# Patient Record
Sex: Male | Born: 1955 | Race: Black or African American | Hispanic: No | Marital: Married | Smoking: Current every day smoker
Health system: Southern US, Community
[De-identification: ages and names within clinical notes are randomized; demographics above are authoritative.]

## PROBLEM LIST (undated history)

## (undated) DIAGNOSIS — J309 Allergic rhinitis, unspecified: Secondary | ICD-10-CM

## (undated) DIAGNOSIS — I509 Heart failure, unspecified: Secondary | ICD-10-CM

## (undated) DIAGNOSIS — F17210 Nicotine dependence, cigarettes, uncomplicated: Secondary | ICD-10-CM

## (undated) DIAGNOSIS — J439 Emphysema, unspecified: Secondary | ICD-10-CM

## (undated) DIAGNOSIS — R809 Proteinuria, unspecified: Secondary | ICD-10-CM

## (undated) DIAGNOSIS — K7689 Other specified diseases of liver: Secondary | ICD-10-CM

## (undated) DIAGNOSIS — F1290 Cannabis use, unspecified, uncomplicated: Secondary | ICD-10-CM

## (undated) DIAGNOSIS — E46 Unspecified protein-calorie malnutrition: Secondary | ICD-10-CM

## (undated) DIAGNOSIS — F1090 Alcohol use, unspecified, uncomplicated: Secondary | ICD-10-CM

## (undated) DIAGNOSIS — F3340 Major depressive disorder, recurrent, in remission, unspecified: Secondary | ICD-10-CM

## (undated) DIAGNOSIS — R161 Splenomegaly, not elsewhere classified: Secondary | ICD-10-CM

## (undated) DIAGNOSIS — R911 Solitary pulmonary nodule: Secondary | ICD-10-CM

## (undated) DIAGNOSIS — E785 Hyperlipidemia, unspecified: Secondary | ICD-10-CM

## (undated) DIAGNOSIS — R7989 Other specified abnormal findings of blood chemistry: Secondary | ICD-10-CM

## (undated) DIAGNOSIS — D649 Anemia, unspecified: Secondary | ICD-10-CM

## (undated) DIAGNOSIS — K5900 Constipation, unspecified: Secondary | ICD-10-CM

## (undated) DIAGNOSIS — F32A Depression, unspecified: Secondary | ICD-10-CM

## (undated) DIAGNOSIS — J449 Chronic obstructive pulmonary disease, unspecified: Secondary | ICD-10-CM

## (undated) DIAGNOSIS — K769 Liver disease, unspecified: Secondary | ICD-10-CM

## (undated) DIAGNOSIS — E209 Hypoparathyroidism, unspecified: Secondary | ICD-10-CM

## (undated) DIAGNOSIS — I10 Essential (primary) hypertension: Secondary | ICD-10-CM

## (undated) DIAGNOSIS — D509 Iron deficiency anemia, unspecified: Secondary | ICD-10-CM

## (undated) DIAGNOSIS — I7121 Aneurysm of the ascending aorta, without rupture: Secondary | ICD-10-CM

## (undated) DIAGNOSIS — F4321 Adjustment disorder with depressed mood: Secondary | ICD-10-CM

## (undated) HISTORY — DX: Alcohol use, unspecified, uncomplicated: F10.90

## (undated) HISTORY — DX: Solitary pulmonary nodule: R91.1

## (undated) HISTORY — DX: Iron deficiency anemia, unspecified: D50.9

## (undated) HISTORY — DX: Anemia, unspecified: D64.9

## (undated) HISTORY — DX: Heart failure, unspecified: I50.9

## (undated) HISTORY — DX: Constipation, unspecified: K59.00

## (undated) HISTORY — DX: Liver disease, unspecified: K76.9

## (undated) HISTORY — DX: Emphysema, unspecified: J43.9

## (undated) HISTORY — DX: Hypoparathyroidism, unspecified: E20.9

## (undated) HISTORY — DX: Chronic obstructive pulmonary disease, unspecified: J44.9

## (undated) HISTORY — DX: Hyperlipidemia, unspecified: E78.5

## (undated) HISTORY — DX: Aneurysm of the ascending aorta, without rupture: I71.21

## (undated) HISTORY — DX: Cannabis use, unspecified, uncomplicated: F12.90

## (undated) HISTORY — DX: Splenomegaly, not elsewhere classified: R16.1

## (undated) HISTORY — DX: Depression, unspecified: F32.A

## (undated) HISTORY — DX: Proteinuria, unspecified: R80.9

## (undated) HISTORY — DX: Adjustment disorder with depressed mood: F43.21

## (undated) HISTORY — DX: Essential (primary) hypertension: I10

## (undated) HISTORY — DX: Unspecified protein-calorie malnutrition: E46

## (undated) HISTORY — DX: Other specified diseases of liver: K76.89

## (undated) HISTORY — DX: Other specified abnormal findings of blood chemistry: R79.89

## (undated) HISTORY — DX: Nicotine dependence, cigarettes, uncomplicated: F17.210

## (undated) HISTORY — DX: Major depressive disorder, recurrent, in remission, unspecified: F33.40

## (undated) HISTORY — DX: Allergic rhinitis, unspecified: J30.9

---

## 2008-10-11 ENCOUNTER — Emergency Department (HOSPITAL_COMMUNITY): Admission: EM | Admit: 2008-10-11 | Discharge: 2008-10-11 | Payer: Self-pay | Admitting: Emergency Medicine

## 2009-12-27 ENCOUNTER — Emergency Department (HOSPITAL_COMMUNITY): Admission: EM | Admit: 2009-12-27 | Discharge: 2009-12-27 | Payer: Self-pay | Admitting: Emergency Medicine

## 2014-10-04 ENCOUNTER — Encounter (HOSPITAL_COMMUNITY): Payer: Self-pay | Admitting: *Deleted

## 2014-10-04 ENCOUNTER — Emergency Department (HOSPITAL_COMMUNITY)
Admission: EM | Admit: 2014-10-04 | Discharge: 2014-10-04 | Disposition: A | Discharge status: Home / Self Care | Payer: Self-pay | Attending: Emergency Medicine | Admitting: Emergency Medicine

## 2014-10-04 ENCOUNTER — Emergency Department (HOSPITAL_COMMUNITY): Payer: Self-pay

## 2014-10-04 DIAGNOSIS — H938X1 Other specified disorders of right ear: Secondary | ICD-10-CM | POA: Insufficient documentation

## 2014-10-04 DIAGNOSIS — Z720 Tobacco use: Secondary | ICD-10-CM | POA: Insufficient documentation

## 2014-10-04 DIAGNOSIS — M25561 Pain in right knee: Secondary | ICD-10-CM | POA: Insufficient documentation

## 2014-10-04 DIAGNOSIS — G8929 Other chronic pain: Secondary | ICD-10-CM | POA: Insufficient documentation

## 2014-10-04 MED ORDER — IBUPROFEN 800 MG PO TABS: 800.0000 mg | ORAL_TABLET | Freq: Three times a day (TID) | ORAL | Status: AC | PRN

## 2014-10-04 NOTE — ED Notes (Signed)
Declined W/C at D/C and was escorted to lobby by RN. 

## 2014-10-04 NOTE — Discharge Instructions (Signed)
Return here as needed.  Follow-up with the clinic provided.  °

## 2014-10-04 NOTE — Discharge Planning (Signed)
Theodore Jacobs J. Theodore Laming, RN, [place], [place] [phone] ED CM consulted to meet with patient concerning f/u care with PCP and patient does not have insurance. Pt presented to [facility] ED today with skin growth at right ear.  Met with patient at bedside, confirmed informaton. Pt  reports not having access to f/u care with PCP, or insurance coverage. Discussed with patient importance and benefits of  establishing PCP, and not utilizing the ED for primary care needs. Pt verbalized understanding and is in agreement.   Pt voiced interest in the [facility] and [place].  [facility] Brochure given with address, phone number, and the services highlighted. Explained that there is a Customer service manager on site who will assist with [employer] and process. Instructed to call or walk in Monday - Friday between the hours of 9- 5:30p to schedule appt for establishing care for PCP. Pt verbalized understanding.

## 2014-10-04 NOTE — ED Notes (Signed)
Pt reports leg pain for a year and a skin growth at ear on Rt  for 2-3 months.

## 2014-10-04 NOTE — ED Provider Notes (Signed)
CSN: [id number]     Arrival date & time 10/04/14  0805 History   First MD Initiated Contact with Patient 10/04/14 [phone]     Chief Complaint  Patient presents with  . Leg Pain     (Consider location/radiation/quality/duration/timing/severity/associated sxs/prior Treatment) HPI Patient presents to the emergency department with several months worth of right knee pain.  The patient is also worried about a skin swelling of by his right ear.  The patient states that this is been present for quite a while.  Patient denies chest pain, shortness breath, weakness, dizziness, headache, blurred vision, back pain, neck pain, cough, fever, or syncope.  Patient states that his knee pain is worse first thing in the morning, gets better as the day goes on History reviewed. No pertinent past medical history. History reviewed. No pertinent past surgical history. History reviewed. No pertinent family history. History  Substance Use Topics  . Smoking status: Current Some Day Smoker    Types: Cigarettes  . Smokeless tobacco: Never Used  . Alcohol Use: Yes     Comment: social    Review of Systems All other systems negative except as documented in the HPI. All pertinent positives and negatives as reviewed in the HPI.    Allergies  Review of patient's allergies indicates not on file.  Home Medications   Prior to Admission medications   Not on File   BP 154/91 mmHg  Pulse 99  Temp(Src) 98.6 F (37 C) (Oral)  Resp 16  Ht 5\' 11"  (1.803 m)  Wt 150 lb (68.04 kg)  BMI 20.93 kg/m2  SpO2 100% Physical Exam  Constitutional: He is oriented to person, place, and time. He appears well-developed and well-nourished.  HENT:  Head: Normocephalic and atraumatic.    Eyes: Pupils are equal, round, and reactive to light.  Cardiovascular: Normal rate, regular rhythm and normal heart sounds.   Pulmonary/Chest: Effort normal and breath sounds normal.  Musculoskeletal:       Right knee: He exhibits normal range  of motion, no swelling, no effusion, no deformity, normal alignment, no LCL laxity, no bony tenderness and normal meniscus. No tenderness found.  Neurological: He is alert and oriented to person, place, and time.  Skin: Skin is warm and dry. No rash noted. No erythema.  Nursing note and vitals reviewed.   ED Course  Procedures (including critical care time) The patient will be treated for this knee pain that is chronic and referred for the area in front of his right ear   MDM   Final diagnoses:  None       Charlestine NightChristopher Jkwon Treptow, PA-C 10/08/14 [id number]  Doug SouSam Jacubowitz, MD 10/08/14 [phone]

## 2014-11-04 ENCOUNTER — Ambulatory Visit: Payer: Self-pay

## 2016-06-24 IMAGING — CR DG KNEE COMPLETE 4+V*R*
4 series · 4 of 4 positions shown · non-contrast
Comparison: None.

CLINICAL DATA: 50-year-old male with chronic lateral right knee
pain for the past several weeks. No known injury.

EXAM:
RIGHT KNEE - COMPLETE 4+ VIEW

[knee ap]
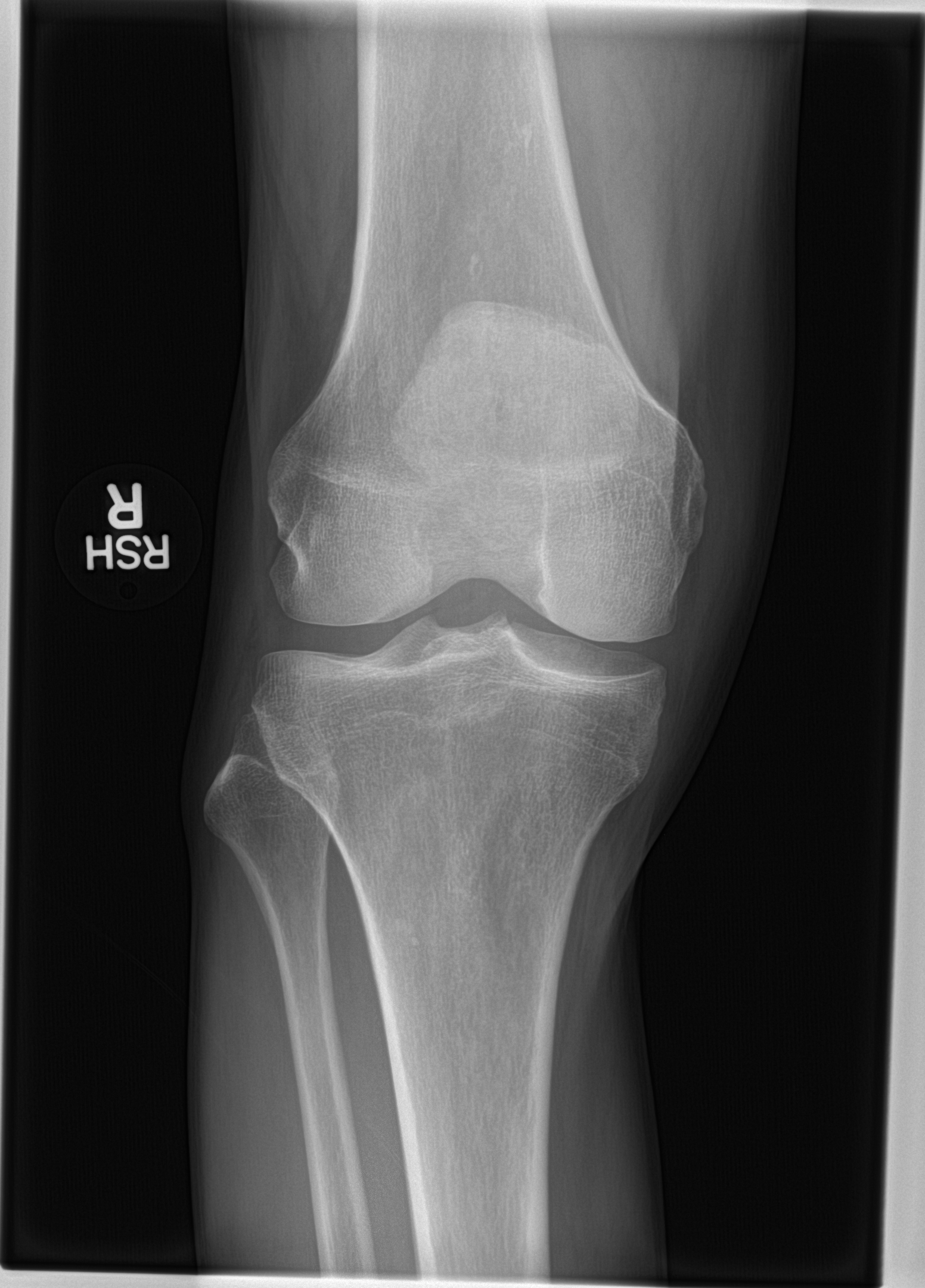

[knee obl (1 of 2)]
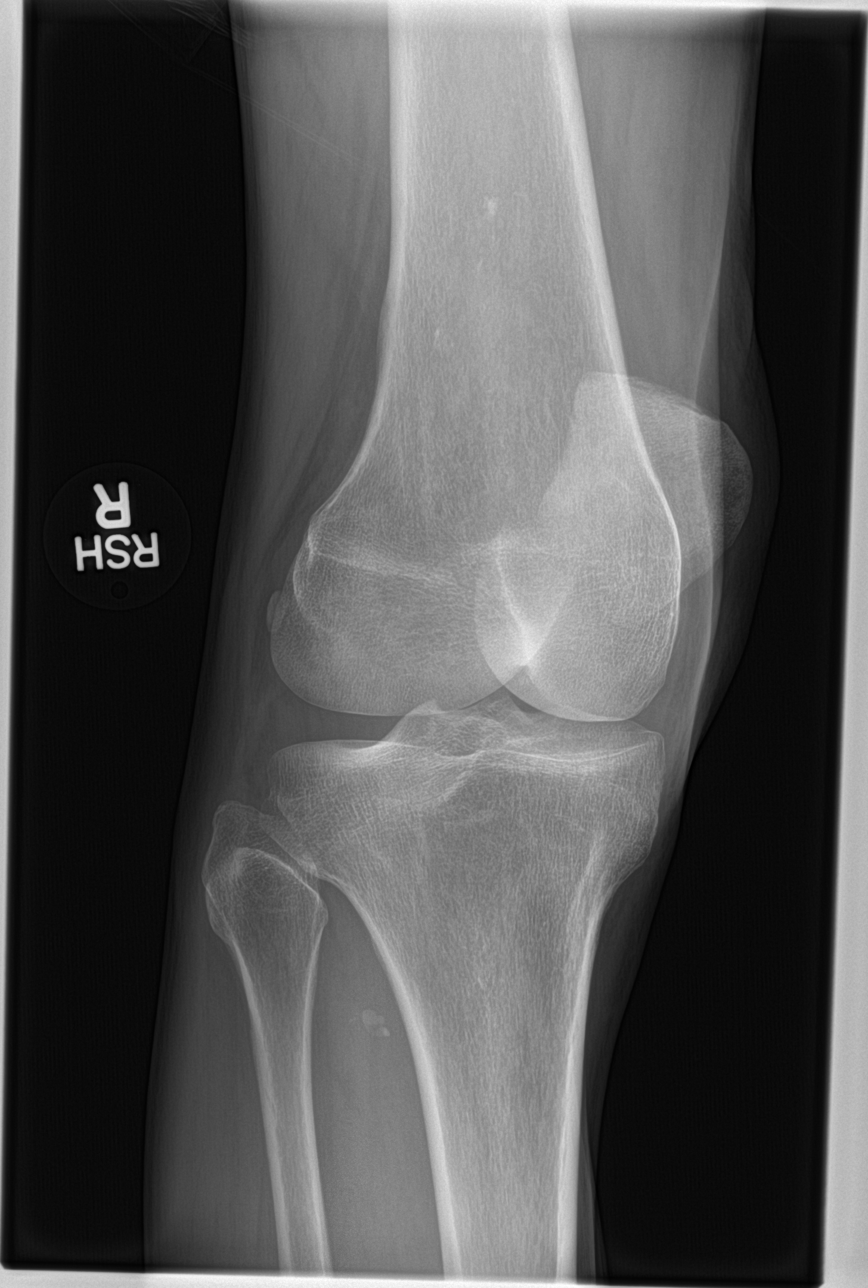

[knee obl (2 of 2)]
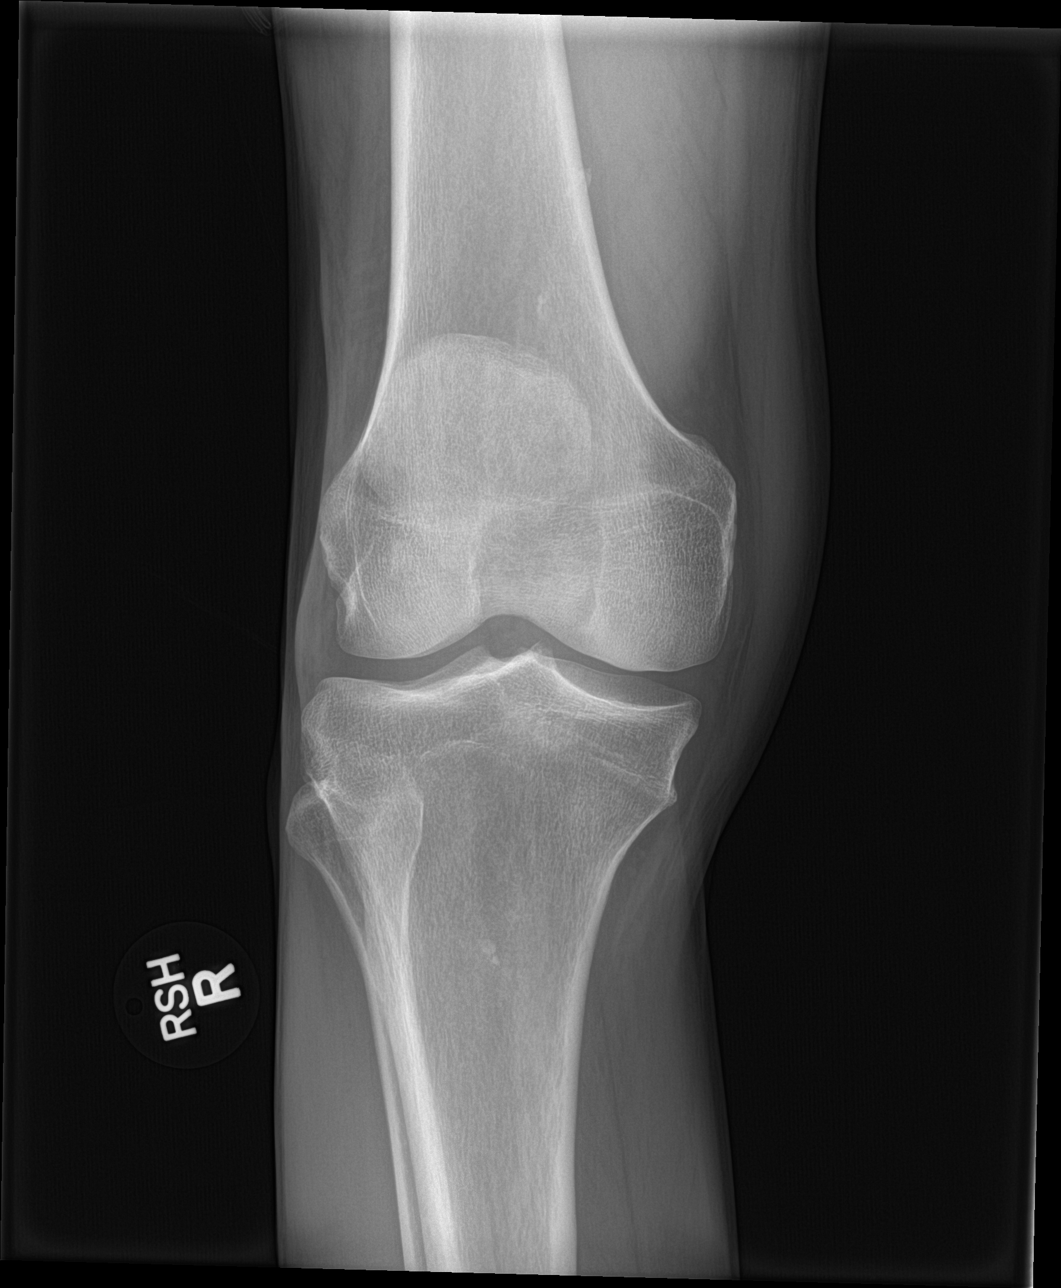

[knee lat]
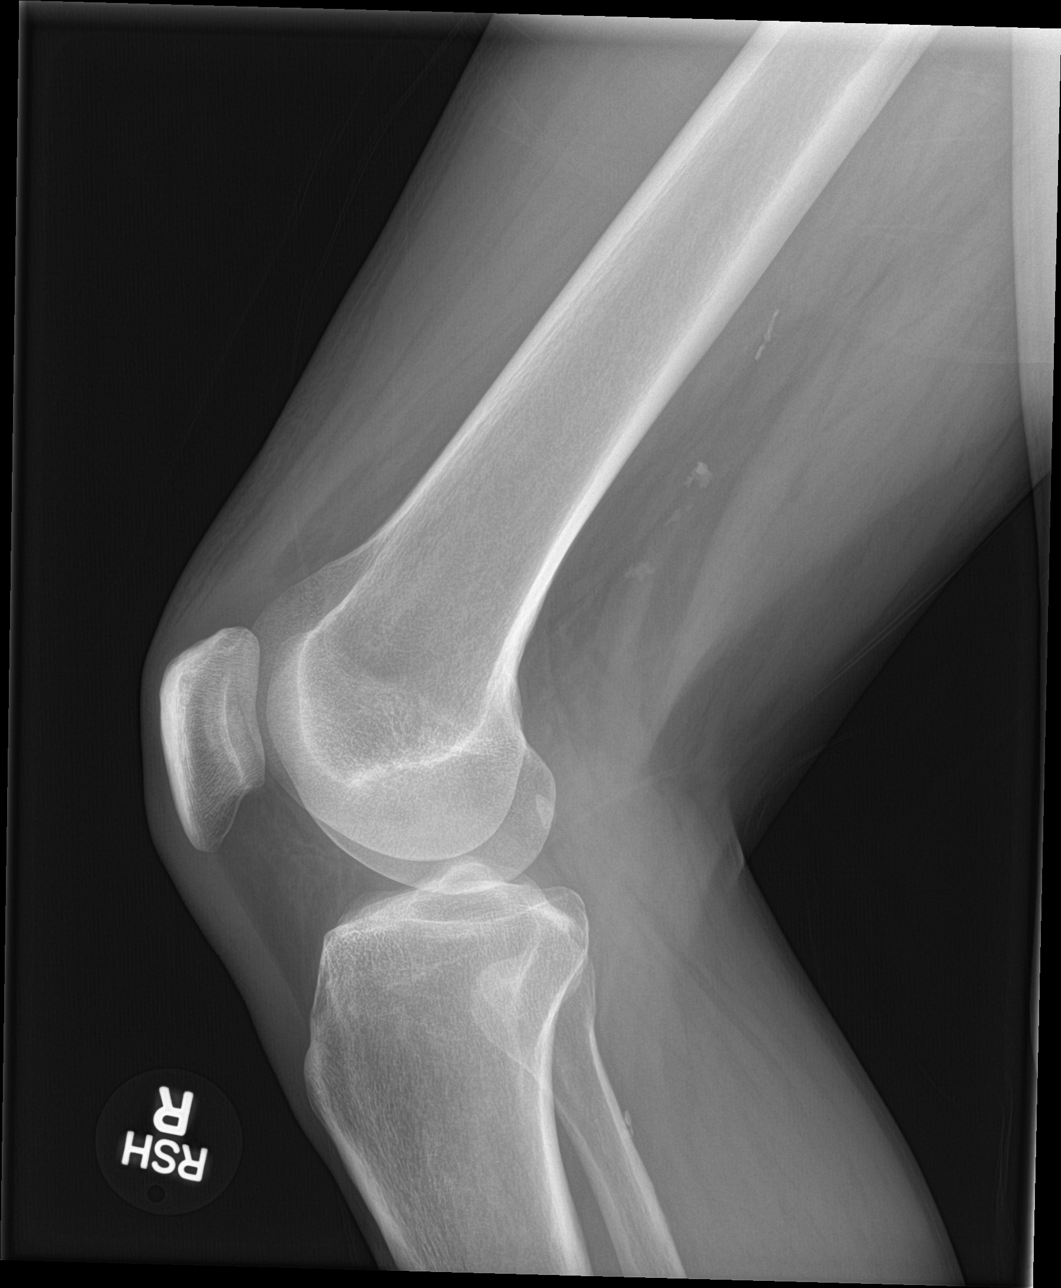

[4 of 4 positions shown; findings below may reference images not displayed]

FINDINGS: There is no evidence of fracture, dislocation, or joint effusion.
There is no evidence of arthropathy or other focal bone abnormality.
Soft tissues are unremarkable. Atherosclerotic plaque identified
within the distal superficial femoral, popliteal and runoff
arteries.
IMPRESSION: No acute osseous abnormality, malalignment or significant
degenerative change.

Mild atherosclerotic vascular calcifications.

## 2022-03-22 ENCOUNTER — Other Ambulatory Visit: Payer: Self-pay | Admitting: Student

## 2022-03-22 DIAGNOSIS — F17210 Nicotine dependence, cigarettes, uncomplicated: Secondary | ICD-10-CM

## 2022-03-28 ENCOUNTER — Other Ambulatory Visit: Payer: Self-pay | Admitting: Student

## 2022-03-28 ENCOUNTER — Ambulatory Visit
Admission: RE | Admit: 2022-03-28 | Discharge: 2022-03-28 | Disposition: A | Discharge status: Home / Self Care | Payer: Medicare HMO | Source: Ambulatory Visit | Attending: Student | Admitting: Student

## 2022-03-28 DIAGNOSIS — F17210 Nicotine dependence, cigarettes, uncomplicated: Secondary | ICD-10-CM

## 2022-07-29 DIAGNOSIS — I77810 Thoracic aortic ectasia: Secondary | ICD-10-CM

## 2022-07-29 HISTORY — DX: Thoracic aortic ectasia: I77.810

## 2022-08-08 ENCOUNTER — Other Ambulatory Visit: Payer: Self-pay | Admitting: Student

## 2022-08-08 DIAGNOSIS — F17210 Nicotine dependence, cigarettes, uncomplicated: Secondary | ICD-10-CM

## 2022-09-04 ENCOUNTER — Inpatient Hospital Stay: Admission: RE | Admit: 2022-09-04 | Payer: Medicare HMO | Source: Ambulatory Visit

## 2022-09-06 ENCOUNTER — Other Ambulatory Visit: Payer: Self-pay | Admitting: Student

## 2022-09-06 ENCOUNTER — Ambulatory Visit
Admission: RE | Admit: 2022-09-06 | Discharge: 2022-09-06 | Disposition: A | Discharge status: Home / Self Care | Payer: Medicare HMO | Source: Ambulatory Visit | Attending: Student | Admitting: Student

## 2022-09-06 DIAGNOSIS — R059 Cough, unspecified: Secondary | ICD-10-CM

## 2022-10-03 ENCOUNTER — Inpatient Hospital Stay: Admission: RE | Admit: 2022-10-03 | Payer: Medicare HMO | Source: Ambulatory Visit

## 2022-10-15 ENCOUNTER — Emergency Department (HOSPITAL_COMMUNITY)
Admission: EM | Admit: 2022-10-15 | Discharge: 2022-10-15 | Disposition: A | Discharge status: Home / Self Care | Payer: Medicare HMO | Attending: Emergency Medicine | Admitting: Emergency Medicine

## 2022-10-15 ENCOUNTER — Emergency Department (HOSPITAL_COMMUNITY): Payer: Medicare HMO

## 2022-10-15 ENCOUNTER — Encounter (HOSPITAL_COMMUNITY): Payer: Self-pay

## 2022-10-15 ENCOUNTER — Other Ambulatory Visit: Payer: Self-pay

## 2022-10-15 DIAGNOSIS — E785 Hyperlipidemia, unspecified: Secondary | ICD-10-CM | POA: Diagnosis not present

## 2022-10-15 DIAGNOSIS — R161 Splenomegaly, not elsewhere classified: Secondary | ICD-10-CM | POA: Diagnosis not present

## 2022-10-15 DIAGNOSIS — I7121 Aneurysm of the ascending aorta, without rupture: Secondary | ICD-10-CM

## 2022-10-15 DIAGNOSIS — J189 Pneumonia, unspecified organism: Secondary | ICD-10-CM | POA: Diagnosis not present

## 2022-10-15 DIAGNOSIS — R911 Solitary pulmonary nodule: Secondary | ICD-10-CM | POA: Diagnosis not present

## 2022-10-15 DIAGNOSIS — J439 Emphysema, unspecified: Secondary | ICD-10-CM | POA: Insufficient documentation

## 2022-10-15 DIAGNOSIS — I700 Atherosclerosis of aorta: Secondary | ICD-10-CM | POA: Diagnosis not present

## 2022-10-15 DIAGNOSIS — I10 Essential (primary) hypertension: Secondary | ICD-10-CM | POA: Insufficient documentation

## 2022-10-15 DIAGNOSIS — Z1152 Encounter for screening for COVID-19: Secondary | ICD-10-CM | POA: Diagnosis not present

## 2022-10-15 DIAGNOSIS — D72829 Elevated white blood cell count, unspecified: Secondary | ICD-10-CM | POA: Insufficient documentation

## 2022-10-15 DIAGNOSIS — R059 Cough, unspecified: Secondary | ICD-10-CM | POA: Diagnosis present

## 2022-10-15 DIAGNOSIS — D649 Anemia, unspecified: Secondary | ICD-10-CM | POA: Diagnosis not present

## 2022-10-15 LAB — CBC WITH DIFFERENTIAL/PLATELET
Abs Immature Granulocytes: 0.21 10*3/uL — ABNORMAL HIGH (ref 0.00–0.07)
Basophils Absolute: 0.1 10*3/uL (ref 0.0–0.1)
Basophils Relative: 1 %
Eosinophils Absolute: 1 10*3/uL — ABNORMAL HIGH (ref 0.0–0.5)
Eosinophils Relative: 4 %
HCT: 30.7 % — ABNORMAL LOW (ref 39.0–52.0)
Hemoglobin: 9 g/dL — ABNORMAL LOW (ref 13.0–17.0)
Immature Granulocytes: 1 %
Lymphocytes Relative: 7 %
Lymphs Abs: 1.5 10*3/uL (ref 0.7–4.0)
MCH: 25.2 pg — ABNORMAL LOW (ref 26.0–34.0)
MCHC: 29.3 g/dL — ABNORMAL LOW (ref 30.0–36.0)
MCV: 86 fL (ref 80.0–100.0)
Monocytes Absolute: 1.2 10*3/uL — ABNORMAL HIGH (ref 0.1–1.0)
Monocytes Relative: 5 %
Neutro Abs: 18.6 10*3/uL — ABNORMAL HIGH (ref 1.7–7.7)
Neutrophils Relative %: 82 %
Platelets: 406 10*3/uL — ABNORMAL HIGH (ref 150–400)
RBC: 3.57 MIL/uL — ABNORMAL LOW (ref 4.22–5.81)
RDW: 15.7 % — ABNORMAL HIGH (ref 11.5–15.5)
WBC: 22.6 10*3/uL — ABNORMAL HIGH (ref 4.0–10.5)
nRBC: 0 % (ref 0.0–0.2)

## 2022-10-15 LAB — VITAMIN B12: Vitamin B-12: 1208 pg/mL — ABNORMAL HIGH (ref 180–914)

## 2022-10-15 LAB — COMPREHENSIVE METABOLIC PANEL
ALT: 14 U/L (ref 0–44)
AST: 27 U/L (ref 15–41)
Albumin: 3.6 g/dL (ref 3.5–5.0)
Alkaline Phosphatase: 188 U/L — ABNORMAL HIGH (ref 38–126)
Anion gap: 13 (ref 5–15)
BUN: <5 mg/dL — ABNORMAL LOW (ref 8–23)
CO2: 24 mmol/L (ref 22–32)
Calcium: 9.3 mg/dL (ref 8.9–10.3)
Chloride: 102 mmol/L (ref 98–111)
Creatinine, Ser: 0.66 mg/dL (ref 0.61–1.24)
GFR, Estimated: >60 mL/min (ref >60)
Glucose, Bld: 102 mg/dL — ABNORMAL HIGH (ref 70–99)
Potassium: 4 mmol/L (ref 3.5–5.1)
Sodium: 139 mmol/L (ref 135–145)
Total Bilirubin: 0.6 mg/dL (ref 0.3–1.2)
Total Protein: 7.6 g/dL (ref 6.5–8.1)

## 2022-10-15 LAB — IRON AND TIBC
Iron: 22 ug/dL — ABNORMAL LOW (ref 45–182)
Saturation Ratios: 6 % — ABNORMAL LOW (ref 17.9–39.5)
TIBC: 393 ug/dL (ref 250–450)
UIBC: 371 ug/dL

## 2022-10-15 LAB — RETICULOCYTES
Immature Retic Fract: 28.5 % — ABNORMAL HIGH (ref 2.3–15.9)
RBC.: 3.37 MIL/uL — ABNORMAL LOW (ref 4.22–5.81)
Retic Count, Absolute: 70.8 10*3/uL (ref 19.0–186.0)
Retic Ct Pct: 2.1 % (ref 0.4–3.1)

## 2022-10-15 LAB — RESP PANEL BY RT-PCR (RSV, FLU A&B, COVID)  RVPGX2
Influenza A by PCR: NEGATIVE
Influenza B by PCR: NEGATIVE
Resp Syncytial Virus by PCR: NEGATIVE
SARS Coronavirus 2 by RT PCR: NEGATIVE

## 2022-10-15 LAB — LACTIC ACID, PLASMA: Lactic Acid, Venous: 1.4 mmol/L (ref 0.5–1.9)

## 2022-10-15 LAB — POC OCCULT BLOOD, ED: Fecal Occult Bld: NEGATIVE

## 2022-10-15 LAB — URINALYSIS, ROUTINE W REFLEX MICROSCOPIC
Bilirubin Urine: NEGATIVE
Glucose, UA: NEGATIVE mg/dL
Hgb urine dipstick: NEGATIVE
Ketones, ur: NEGATIVE mg/dL
Leukocytes,Ua: NEGATIVE
Nitrite: NEGATIVE
Protein, ur: NEGATIVE mg/dL
Specific Gravity, Urine: 1.023 (ref 1.005–1.030)
pH: 7 (ref 5.0–8.0)

## 2022-10-15 LAB — TROPONIN I (HIGH SENSITIVITY)
Troponin I (High Sensitivity): 6 ng/L (ref <18)
Troponin I (High Sensitivity): 7 ng/L (ref <18)

## 2022-10-15 LAB — FERRITIN: Ferritin: 32 ng/mL (ref 24–336)

## 2022-10-15 LAB — FOLATE: Folate: 10.6 ng/mL (ref >5.9)

## 2022-10-15 LAB — BRAIN NATRIURETIC PEPTIDE: B Natriuretic Peptide: 203.6 pg/mL — ABNORMAL HIGH (ref 0.0–100.0)

## 2022-10-15 MED ORDER — FERROUS SULFATE 325 (65 FE) MG PO TABS: 325.0000 mg | ORAL_TABLET | Freq: Every day | ORAL | 0 refills | Status: AC

## 2022-10-15 MED ORDER — SODIUM CHLORIDE 0.9 % IV SOLN
500.0000 mg | Freq: Once | INTRAVENOUS | Status: AC
  Administered 2022-10-15: 500 mg via INTRAVENOUS
  Filled 2022-10-15: qty 5

## 2022-10-15 MED ORDER — IOHEXOL 350 MG/ML SOLN
50.0000 mL | Freq: Once | INTRAVENOUS | Status: AC | PRN
  Administered 2022-10-15: 50 mL via INTRAVENOUS

## 2022-10-15 MED ORDER — IOHEXOL 350 MG/ML SOLN
75.0000 mL | Freq: Once | INTRAVENOUS | Status: AC | PRN
  Administered 2022-10-15: 75 mL via INTRAVENOUS

## 2022-10-15 MED ORDER — SODIUM CHLORIDE 0.9 % IV BOLUS
1000.0000 mL | Freq: Once | INTRAVENOUS | Status: AC
  Administered 2022-10-15: 1000 mL via INTRAVENOUS

## 2022-10-15 MED ORDER — DOXYCYCLINE HYCLATE 100 MG PO CAPS: 100.0000 mg | ORAL_CAPSULE | Freq: Two times a day (BID) | ORAL | 0 refills | Status: DC

## 2022-10-15 MED ORDER — SODIUM CHLORIDE 0.9 % IV SOLN
1.0000 g | Freq: Once | INTRAVENOUS | Status: AC
  Administered 2022-10-15: 1 g via INTRAVENOUS
  Filled 2022-10-15: qty 10

## 2022-10-15 NOTE — ED Triage Notes (Signed)
Pt sent by PCP d/t elevated WBCs.  Pt denies complaints.  Pt reports he had his blood drawn for his annual physical on 3/15 and received call that his WBCs were elevated and he needs further workup.

## 2022-10-15 NOTE — Discharge Instructions (Addendum)
Follow up with the GI doctor as planned.  You will need a repeat CT scan in 6-12 months to evaluate the lung nodule and the dilation of your aorta.

## 2022-10-15 NOTE — ED Notes (Signed)
Patient verbalizes understanding of discharge instructions. Opportunity for questioning and answers were provided. Pt discharged from ED. 

## 2022-10-15 NOTE — ED Provider Notes (Signed)
[place] Provider Note   CSN: [id number] Arrival date & time: 10/15/22  [id number]     History  Chief Complaint  Patient presents with   Abnormal Labs    Theodore Jacobs is a 67 y.o. male.  Pt is a 67 yo male with pmhx significant for htn and hld.  Pt was seen by his PCP for his annual visit and had blood work done.  He said he was told his WBC was elevated and he needed to come to the ED for a chest CT.  Pt said he's had uri sx and cough.  He has been on abx (? Name), but those are done.  Pt said he is feeling better, but still has a little cough.  No fever.  Pt has not been to a doctor for years before this check up.       Home Medications Prior to Admission medications   Medication Sig Start Date End Date Taking? Authorizing Provider  doxycycline (VIBRAMYCIN) 100 MG capsule Take 1 capsule (100 mg total) by mouth 2 (two) times daily. 10/15/22  Yes Isla Pence, MD  ferrous sulfate 325 (65 FE) MG tablet Take 1 tablet (325 mg total) by mouth daily. 10/15/22  Yes Isla Pence, MD  ibuprofen (ADVIL,MOTRIN) 800 MG tablet Take 1 tablet (800 mg total) by mouth every 8 (eight) hours as needed. 10/04/14   Dalia Heading, PA-C      Allergies    Patient has no known allergies.    Review of Systems   Review of Systems  Respiratory:  Positive for cough.   All other systems reviewed and are negative.   Physical Exam Updated Vital Signs BP (!) 125/95   Pulse 85   Temp 98 F (36.7 C) (Oral)   Resp 15   Ht 5\' 6"  (1.676 m)   Wt 61.2 kg   SpO2 100%   BMI 21.79 kg/m  Physical Exam Vitals and nursing note reviewed.  Constitutional:      Appearance: Normal appearance.  HENT:     Head: Normocephalic and atraumatic.     Right Ear: External ear normal.     Left Ear: External ear normal.     Nose: Nose normal.     Mouth/Throat:     Mouth: Mucous membranes are dry.  Eyes:     Extraocular Movements: Extraocular movements intact.      Conjunctiva/sclera: Conjunctivae normal.     Pupils: Pupils are equal, round, and reactive to light.  Cardiovascular:     Rate and Rhythm: Normal rate and regular rhythm.     Pulses: Normal pulses.     Heart sounds: Normal heart sounds.  Pulmonary:     Effort: Pulmonary effort is normal.     Breath sounds: Normal breath sounds.  Abdominal:     General: Abdomen is flat. Bowel sounds are normal.     Palpations: Abdomen is soft.  Genitourinary:    Rectum: Guaiac result negative.  Musculoskeletal:        General: Normal range of motion.     Cervical back: Normal range of motion and neck supple.  Skin:    General: Skin is warm.     Capillary Refill: Capillary refill takes less than 2 seconds.  Neurological:     General: No focal deficit present.     Mental Status: He is alert and oriented to person, place, and time.  Psychiatric:        Mood  and Affect: Mood normal.        Behavior: Behavior normal.     ED Results / Procedures / Treatments   Labs (all labs ordered are listed, but only abnormal results are displayed) Labs Reviewed  CBC WITH DIFFERENTIAL/PLATELET - Abnormal; Notable for the following components:      Result Value   WBC 22.6 (*)    RBC 3.57 (*)    Hemoglobin 9.0 (*)    HCT 30.7 (*)    MCH 25.2 (*)    MCHC 29.3 (*)    RDW 15.7 (*)    Platelets 406 (*)    Neutro Abs 18.6 (*)    Monocytes Absolute 1.2 (*)    Eosinophils Absolute 1.0 (*)    Abs Immature Granulocytes 0.21 (*)    All other components within normal limits  BRAIN NATRIURETIC PEPTIDE - Abnormal; Notable for the following components:   B Natriuretic Peptide 203.6 (*)    All other components within normal limits  COMPREHENSIVE METABOLIC PANEL - Abnormal; Notable for the following components:   Glucose, Bld 102 (*)    BUN <5 (*)    Alkaline Phosphatase 188 (*)    All other components within normal limits  URINALYSIS, ROUTINE W REFLEX MICROSCOPIC - Abnormal; Notable for the following components:    Color, Urine STRAW (*)    All other components within normal limits  VITAMIN B12 - Abnormal; Notable for the following components:   Vitamin B-12 1,208 (*)    All other components within normal limits  IRON AND TIBC - Abnormal; Notable for the following components:   Iron 22 (*)    Saturation Ratios 6 (*)    All other components within normal limits  RETICULOCYTES - Abnormal; Notable for the following components:   RBC. 3.37 (*)    Immature Retic Fract 28.5 (*)    All other components within normal limits  RESP PANEL BY RT-PCR (RSV, FLU A&B, COVID)  RVPGX2  CULTURE, BLOOD (ROUTINE X 2)  CULTURE, BLOOD (ROUTINE X 2)  LACTIC ACID, PLASMA  FOLATE  FERRITIN  POC OCCULT BLOOD, ED  TROPONIN I (HIGH SENSITIVITY)  TROPONIN I (HIGH SENSITIVITY)    EKG EKG Interpretation  Date/Time:  Tuesday October 15 2022 08:01:41 EDT Ventricular Rate:  87 PR Interval:  142 QRS Duration: 85 QT Interval:  367 QTC Calculation: 442 R Axis:   70 Text Interpretation: Sinus rhythm No old tracing to compare Confirmed by Isla Pence [phone]) on 10/15/2022 8:54:06 AM  Radiology CT Angio Chest PE W and/or Wo Contrast  Result Date: 10/15/2022 CLINICAL DATA:  Elevated white blood cells. EXAM: CT ANGIOGRAPHY CHEST CT ABDOMEN AND PELVIS WITH CONTRAST TECHNIQUE: Multidetector CT imaging of the chest was performed using the standard protocol during bolus administration of intravenous contrast. Multiplanar CT image reconstructions and MIPs were obtained to evaluate the vascular anatomy. Multidetector CT imaging of the abdomen and pelvis was performed using the standard protocol during bolus administration of intravenous contrast. RADIATION DOSE REDUCTION: This exam was performed according to the departmental dose-optimization program which includes automated exposure control, adjustment of the mA and/or kV according to patient size and/or use of iterative reconstruction technique. CONTRAST:  39mL OMNIPAQUE IOHEXOL  350 MG/ML SOLN; 48mL OMNIPAQUE IOHEXOL 350 MG/ML SOLN INDICATION: Chest radiograph 09/06/2022. FINDINGS: CTA CHEST FINDINGS Cardiovascular: There is adequate opacification of the pulmonary arteries to the segmental level. There is no evidence of pulmonary embolism. The ascending thoracic aorta is aneurysmal measuring up to 4.2 cm. The  descending thoracic aorta is normal in caliber. The heart size is normal. There is no pericardial effusion. Coronary artery calcifications and mild aortic valve calcifications are noted. Mediastinum/nodes: The thyroid is unremarkable. The esophagus is grossly unremarkable. There is a prominent prevascular lymph node measuring up to 8 mm in short axis. There is an 8 mm precarinal lymph node. There is 1.1 cm subcarinal lymph node. There are subcentimeter bilateral hilar lymph nodes. Lungs/Pleura: The trachea and central airways are patent with mild central bronchial wall thickening. There is dependent subsegmental atelectasis in the lung bases. There is additional patchy opacity in the right middle lobe in the lingula. There is moderate apical predominant emphysema. There is no focal consolidation or pulmonary edema. There is no pleural effusion or pneumothorax. There is a 7 mm parenchymal nodule in the right middle lobe abutting the pulmonary artery (6-66). Musculoskeletal: There is no acute osseous abnormality or suspicious osseous lesion. Review of the MIP images confirms the above findings. CT ABDOMEN and PELVIS FINDINGS Hepatobiliary: There is a 0.8 cm mildly hyperenhancing lesion in the left hepatic lobe (3-16). The gallbladder is unremarkable. Pancreas: Unremarkable. Spleen: The spleen is mildly enlarged. Adrenals/Urinary Tract: The adrenals are unremarkable. There are no suspicious renal lesions. There are no stones in either kidney. There is contrast throughout the ureters and bladder which precludes evaluation for ureteral stones; however, there is no hydronephrosis or  hydroureter. There is no filling defect in the bladder. Stomach/Bowel: The stomach is unremarkable. There is no evidence of bowel obstruction. There is no abnormal bowel wall thickening or inflammatory change. The appendix is normal. Vascular/Lymphatic: The abdominal aorta is normal in course and caliber with calcified atherosclerotic plaque throughout. The main portal and splenic veins are patent. There is no abdominal or pelvic lymphadenopathy. Reproductive: The prostate and seminal vesicles are unremarkable. Other: There is no ascites or free air. Musculoskeletal: There is no acute osseous abnormality or suspicious osseous lesion. There is mild dextrocurvature in the lumbar spine with prominent Schmorl's nodes at L2 and L3. Review of the MIP images confirms the above findings. IMPRESSION: 1. Patchy opacity in the right middle lobe and lingula is favored to reflect atelectasis, though aspiration or infection could have a similar appearance. 2. 7 mm nodule in the right middle lobe. Per Fleischner Society Guidelines, recommend a non-contrast Chest CT at 6-12 months, then follow-up non-contrast Chest CT at 18-24 months. 3. Prominent but not pathologically enlarged mediastinal lymph nodes are favored reactive, recommend attention on follow-up. 4. 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 Annual follow-up is recommended, though note that this can also be assessed at the time of follow-up chest CT for the above-described nodule. 5. No acute finding in the abdomen or pelvis. 6. 0.8 cm hyperenhancing lesion in the left hepatic lobe may reflect a flash filling hemangioma but is technically indeterminate on this single-phase study. Recommend nonemergent abdominal MRI with and without contrast for characterization. 7. Mild splenomegaly. 8. Aortic Atherosclerosis (ICD10-I70.0) and Emphysema (ICD10-J43.9). Electronically Signed   By: Valetta Mole M.D.   On: 10/15/2022 10:32   CT ABDOMEN PELVIS W  CONTRAST  Result Date: 10/15/2022 CLINICAL DATA:  Elevated white blood cells. EXAM: CT ANGIOGRAPHY CHEST CT ABDOMEN AND PELVIS WITH CONTRAST TECHNIQUE: Multidetector CT imaging of the chest was performed using the standard protocol during bolus administration of intravenous contrast. Multiplanar CT image reconstructions and MIPs were obtained to evaluate the vascular anatomy. Multidetector CT imaging of the abdomen and pelvis was performed using the  standard protocol during bolus administration of intravenous contrast. RADIATION DOSE REDUCTION: This exam was performed according to the departmental dose-optimization program which includes automated exposure control, adjustment of the mA and/or kV according to patient size and/or use of iterative reconstruction technique. CONTRAST:  63mL OMNIPAQUE IOHEXOL 350 MG/ML SOLN; 35mL OMNIPAQUE IOHEXOL 350 MG/ML SOLN INDICATION: Chest radiograph 09/06/2022. FINDINGS: CTA CHEST FINDINGS Cardiovascular: There is adequate opacification of the pulmonary arteries to the segmental level. There is no evidence of pulmonary embolism. The ascending thoracic aorta is aneurysmal measuring up to 4.2 cm. The descending thoracic aorta is normal in caliber. The heart size is normal. There is no pericardial effusion. Coronary artery calcifications and mild aortic valve calcifications are noted. Mediastinum/nodes: The thyroid is unremarkable. The esophagus is grossly unremarkable. There is a prominent prevascular lymph node measuring up to 8 mm in short axis. There is an 8 mm precarinal lymph node. There is 1.1 cm subcarinal lymph node. There are subcentimeter bilateral hilar lymph nodes. Lungs/Pleura: The trachea and central airways are patent with mild central bronchial wall thickening. There is dependent subsegmental atelectasis in the lung bases. There is additional patchy opacity in the right middle lobe in the lingula. There is moderate apical predominant emphysema. There is no focal  consolidation or pulmonary edema. There is no pleural effusion or pneumothorax. There is a 7 mm parenchymal nodule in the right middle lobe abutting the pulmonary artery (6-66). Musculoskeletal: There is no acute osseous abnormality or suspicious osseous lesion. Review of the MIP images confirms the above findings. CT ABDOMEN and PELVIS FINDINGS Hepatobiliary: There is a 0.8 cm mildly hyperenhancing lesion in the left hepatic lobe (3-16). The gallbladder is unremarkable. Pancreas: Unremarkable. Spleen: The spleen is mildly enlarged. Adrenals/Urinary Tract: The adrenals are unremarkable. There are no suspicious renal lesions. There are no stones in either kidney. There is contrast throughout the ureters and bladder which precludes evaluation for ureteral stones; however, there is no hydronephrosis or hydroureter. There is no filling defect in the bladder. Stomach/Bowel: The stomach is unremarkable. There is no evidence of bowel obstruction. There is no abnormal bowel wall thickening or inflammatory change. The appendix is normal. Vascular/Lymphatic: The abdominal aorta is normal in course and caliber with calcified atherosclerotic plaque throughout. The main portal and splenic veins are patent. There is no abdominal or pelvic lymphadenopathy. Reproductive: The prostate and seminal vesicles are unremarkable. Other: There is no ascites or free air. Musculoskeletal: There is no acute osseous abnormality or suspicious osseous lesion. There is mild dextrocurvature in the lumbar spine with prominent Schmorl's nodes at L2 and L3. Review of the MIP images confirms the above findings. IMPRESSION: 1. Patchy opacity in the right middle lobe and lingula is favored to reflect atelectasis, though aspiration or infection could have a similar appearance. 2. 7 mm nodule in the right middle lobe. Per Fleischner Society Guidelines, recommend a non-contrast Chest CT at 6-12 months, then follow-up non-contrast Chest CT at 18-24 months.  3. Prominent but not pathologically enlarged mediastinal lymph nodes are favored reactive, recommend attention on follow-up. 4. 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 Annual follow-up is recommended, though note that this can also be assessed at the time of follow-up chest CT for the above-described nodule. 5. No acute finding in the abdomen or pelvis. 6. 0.8 cm hyperenhancing lesion in the left hepatic lobe may reflect a flash filling hemangioma but is technically indeterminate on this single-phase study. Recommend nonemergent abdominal MRI with and without contrast for characterization.  7. Mild splenomegaly. 8. Aortic Atherosclerosis (ICD10-I70.0) and Emphysema (ICD10-J43.9). Electronically Signed   By: Valetta Mole M.D.   On: 10/15/2022 10:32    Procedures Procedures    Medications Ordered in ED Medications  sodium chloride 0.9 % bolus 1,000 mL (0 mLs Intravenous Stopped 10/15/22 1117)  iohexol (OMNIPAQUE) 350 MG/ML injection 75 mL (75 mLs Intravenous Contrast Given 10/15/22 1006)  iohexol (OMNIPAQUE) 350 MG/ML injection 50 mL (50 mLs Intravenous Contrast Given 10/15/22 1024)  cefTRIAXone (ROCEPHIN) 1 g in sodium chloride 0.9 % 100 mL IVPB (0 g Intravenous Stopped 10/15/22 1204)  azithromycin (ZITHROMAX) 500 mg in sodium chloride 0.9 % 250 mL IVPB (0 mg Intravenous Stopped 10/15/22 1312)    ED Course/ Medical Decision Making/ A&P                             Medical Decision Making Amount and/or Complexity of Data Reviewed Labs: ordered. Radiology: ordered.  Risk OTC drugs. Prescription drug management.   This patient presents to the ED for concern of cough, this involves an extensive number of treatment options, and is a complaint that carries with it a high risk of complications and morbidity.  The differential diagnosis includes pna, covid/flu/rsv, bronchitis   Co morbidities that complicate the patient evaluation  Htn and hld   Additional history  obtained:  Additional history obtained from epic chart review  Lab Tests:  I Ordered, and personally interpreted labs.  The pertinent results include:  cbc with wbc 22.6, hgb 9.0 (no old labs); covid/flu/rsv neg, cmp nl, trop nl, lactic nl   Imaging Studies ordered:  I ordered imaging studies including ct chest/abd pelvis I independently visualized and interpreted imaging which showed   . Patchy opacity in the right middle lobe and lingula is favored to  reflect atelectasis, though aspiration or infection could have a  similar appearance.  2. 7 mm nodule in the right middle lobe. Per Fleischner Society  Guidelines, recommend a non-contrast Chest CT at 6-12 months, then  follow-up non-contrast Chest CT at 18-24 months.  3. Prominent but not pathologically enlarged mediastinal lymph nodes  are favored reactive, recommend attention on follow-up.  4. 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  Annual follow-up is recommended, though note that this can also be  assessed at the time of follow-up chest CT for the above-described  nodule.  5. No acute finding in the abdomen or pelvis.  6. 0.8 cm hyperenhancing lesion in the left hepatic lobe may reflect  a flash filling hemangioma but is technically indeterminate on this  single-phase study. Recommend nonemergent abdominal MRI with and  without contrast for characterization.  7. Mild splenomegaly.  8. Aortic Atherosclerosis (ICD10-I70.0) and Emphysema (ICD10-J43.9).   I agree with the radiologist interpretation   Cardiac Monitoring:  The patient was maintained on a cardiac monitor.  I personally viewed and interpreted the cardiac monitored which showed an underlying rhythm of: nsr   Medicines ordered and prescription drug management:  I ordered medication including ivfs  for sx  Reevaluation of the patient after these medicines showed that the patient improved I have reviewed the patients home medicines and have  made adjustments as needed   Test Considered:  ct   Critical Interventions:  abx   Problem List / ED Course:  Elevated wbc, cough, and possible pna on CT:  as pt is symptomatic, I will treat with doxy.  I called [facility] and  pt was on Levaquin in Feb. Anemia:  pt is guaiac negative.  Pt said PCP has referred him to GI for a colonoscopy.  Anemia panel is pending, but I will start him on iron. 7 mm nodule RML:  pt will need repeat CT scan in 6-12 months Thoracic aneurysm:  annual f/u recommended.     Reevaluation:  After the interventions noted above, I reevaluated the patient and found that they have :improved   Social Determinants of Health:  Lives at home   Dispostion:  After consideration of the diagnostic results and the patients response to treatment, I feel that the patent would benefit from discharge with outpatient f/u.          Final Clinical Impression(s) / ED Diagnoses Final diagnoses:  Lung nodule seen on imaging study  Aneurysm of ascending aorta without rupture (HCC)  Anemia, unspecified type  Community acquired pneumonia, unspecified laterality    Rx / DC Orders ED Discharge Orders          Ordered    doxycycline (VIBRAMYCIN) 100 MG capsule  2 times daily        10/15/22 1313    ferrous sulfate 325 (65 FE) MG tablet  Daily        10/15/22 1313              Isla Pence, MD 10/15/22 1520

## 2022-10-16 LAB — CULTURE, BLOOD (ROUTINE X 2): Culture: NO GROWTH

## 2022-10-17 LAB — CULTURE, BLOOD (ROUTINE X 2): Special Requests: ADEQUATE

## 2022-10-18 LAB — CULTURE, BLOOD (ROUTINE X 2): Culture: NO GROWTH

## 2022-10-19 LAB — CULTURE, BLOOD (ROUTINE X 2)

## 2022-10-29 ENCOUNTER — Other Ambulatory Visit: Payer: Medicare HMO

## 2023-08-28 ENCOUNTER — Other Ambulatory Visit: Payer: Self-pay | Admitting: Student

## 2023-08-28 DIAGNOSIS — F17210 Nicotine dependence, cigarettes, uncomplicated: Secondary | ICD-10-CM

## 2024-02-25 ENCOUNTER — Ambulatory Visit: Admitting: Cardiology

## 2024-02-25 DIAGNOSIS — I10 Essential (primary) hypertension: Secondary | ICD-10-CM | POA: Insufficient documentation

## 2024-02-25 DIAGNOSIS — F17200 Nicotine dependence, unspecified, uncomplicated: Secondary | ICD-10-CM | POA: Insufficient documentation

## 2024-02-25 DIAGNOSIS — I77810 Thoracic aortic ectasia: Secondary | ICD-10-CM | POA: Insufficient documentation

## 2024-02-25 DIAGNOSIS — E785 Hyperlipidemia, unspecified: Secondary | ICD-10-CM | POA: Insufficient documentation

## 2024-02-25 NOTE — Progress Notes (Deleted)
 Cardiology Office Note:  .   Date:  02/25/2024  ID:  Theodore Jacobs, DOB 02/04/1956, MRN [id number] PCP: Campbell Reynolds, NP  [facility] Providers Cardiologist:  None { Click to update primary MD,subspecialty MD or APP then REFRESH:1}    No chief complaint on file.   Patient Profile: .     Theodore Jacobs is a *** 68 y.o. male 1/3 pack/day smoker with a PMH notable forascending aortic dilation , COPD/emphysema, HTN, HLD, marijuana and alcohol use, who presents here for cardiology evaluation at the request of Campbell Reynolds, NP.  [facility])  {There is no content from the last Narrative History section.}      Theodore Jacobs was seen by Reynolds Campbell, NP on August 28, 2023 for wellness visit.  Denied chest pain, dyspnea, or edema (despite carrying a diagnosis of stage B asymptomatic heart failure ).  No palpitations.  Major notable symptom was cough and congestion likely related to allergies as it was relieved with nasal spray.  He was on 81 mg aspirin, rosuvastatin 10 mg and lisinopril 10 mg.  Apparently 2D echocardiogram was ordered that revealed mildly dilated aortic root and ascending aorta .  Measurements not provided.  However the E consult recommended repeat echo in 6 months followed by every 1 to 2 years.  As well as clinical screening for familial aortopathy and Marfan's.  Recommended blood pressure control.  Subjective  Discussed the use of AI scribe software for clinical note transcription with the patient, who gave verbal consent to proceed.  History of Present Illness      Cardiovascular ROS: {roscv:[id number]}  ROS:  Review of Systems - {ros master:310782}    Objective    Studies Reviewed: SABRA        Labs from 08/01/2023: TC 161, TG 68, HDL 88, LDL 76.  BUN 5, Cr 0.72, K+ 5.1 AST 47 (borderline elevated).  Hgb 11.2.  BNP 31.  Hgb A1c 4.5  ECHO: Normal LVEF 55 to 60%.  Normal LV size and function.  Normal wall motion.  Normal diastolic pattern.  Mild  AoV thickening.  Mildly dilated ascending aorta .  Risk Assessment/Calculations:   {Does this patient have ATRIAL FIBRILLATION?:[phone]} No BP recorded.  {Refresh Note OR Click here to enter BP  :1}***         Physical Exam:   VS:  There were no vitals taken for this visit.   Wt Readings from Last 3 Encounters:  10/15/22 135 lb (61.2 kg)  10/04/14 150 lb (68 kg)    GEN: Well nourished, well developed in no acute distress; *** NECK: No JVD; No carotid bruits CARDIAC: Normal S1, S2; RRR, no murmurs, rubs, gallops RESPIRATORY:  Clear to auscultation without rales, wheezing or rhonchi ; nonlabored, good air movement. ABDOMEN: Soft, non-tender, non-distended EXTREMITIES:  No edema; No deformity      ASSESSMENT AND PLAN: .    Problem List Items Addressed This Visit       Cardiology Problems   Dilation of thoracic aorta (HCC) - Primary   Hyperlipidemia (Chronic)   Primary hypertension     Other   Current smoker    Assessment and Plan Assessment & Plan        {Are you ordering a CV Procedure (e.g. stress test, cath, DCCV, TEE, etc)?   Press F2        :[id number]}   Follow-Up: No follow-ups on file.  Total time spent: *** min spent with patient + ***  min spent charting = *** min    Signed, Alm MICAEL Clay, MD, MS Alm Clay, M.D., M.S. Interventional Chartered certified accountant  Pager # [phone]

## 2024-05-26 ENCOUNTER — Ambulatory Visit: Admitting: Cardiology

## 2024-08-03 ENCOUNTER — Ambulatory Visit: Admitting: Cardiology

## 2024-08-03 NOTE — Progress Notes (Deleted)
" °  Cardiology Office Note:  .   Date:  08/03/2024  ID:  Theodore Jacobs, DOB 18-Sep-1955, MRN [id number] PCP: Campbell Reynolds, NP  [facility] Providers Cardiologist:  None { Click to update primary MD,subspecialty MD or APP then REFRESH:1}    No chief complaint on file.   Patient Profile: .     Theodore Jacobs is a *** 69 y.o. male *** with a PMH notable for *** who presents here for *** at the request of Campbell Reynolds, NP.  PMH:  Hypertension with hypertensive heart disease , (elevated BNP) Dilated thoracic aorta Cigarette smoker-COPD/emphysema; lung nodule Hyperlipidemia Hepatic cyst-history of alcohol abuse Iron deficiency anemia     Theodore Jacobs was last seen on ***   Subjective  Discussed the use of AI scribe software for clinical note transcription with the patient, who gave verbal consent to proceed.  History of Present Illness      Cardiovascular ROS: {roscv:310661}  ROS:  Review of Systems - {ros master:310782}    Objective   Medications per last PCP note April 2025: Aspirin 81 mg daily, lisinopril 10 mL a day, rosuvastatin 10 One-A-Day, Symbicort 160-4.5 mcg daily, iron supplement 325 mg daily, folic acid  1 mg daily, Flonase spray 50 mcg daily, albuterol PRN  Family History: Sister with heart failure .  Mother with hypertension and father with Parkinson's disease.  Social History: Father 1 son with 1 grandson and 2 great grandchildren; 14.7-pack-year smoking history   Studies Reviewed: SABRA        Labs from PCP note April 2025: TC 161, HDL 68, TG 88, LDL 76. Glu 75, Cr 0.72, sodium 142, potassium 5.1.  AST 47 ALT 33 Hgb 11.2, PLT 301; BNP 31.  A1c 4.5 Results  ECHO: *** CATH: *** MONITOR: *** CT: ***  Risk Assessment/Calculations:   {Does this patient have ATRIAL FIBRILLATION?:[id number]} No BP recorded.  {Refresh Note OR Click here to enter BP  :1}***         Physical Exam:   VS:  There were no vitals taken for this visit.   Wt  Readings from Last 3 Encounters:  10/15/22 135 lb (61.2 kg)  10/04/14 150 lb (68 kg)    Physical Exam    GEN: Well nourished, well developed in no acute distress; *** NECK: No JVD; No carotid bruits CARDIAC: Normal S1, S2; RRR, no murmurs, rubs, gallops RESPIRATORY:  Clear to auscultation without rales, wheezing or rhonchi ; nonlabored, good air movement. ABDOMEN: Soft, non-tender, non-distended EXTREMITIES:  No edema; No deformity      ASSESSMENT AND PLAN: .    @PROPAPOVERVIEWWITHORDERS @  Assessment and Plan Assessment & Plan        {Are you ordering a CV Procedure (e.g. stress test, cath, DCCV, TEE, etc)?   Press F2        :[id number]}   Follow-Up: No follow-ups on file.  I spent *** minutes in the care of Theodore Jacobs today including {CHL AMB CAR Time Based Billing Options STW (Optional):[phone]::documenting in the encounter.}      Signed, Alm MICAEL Clay, MD, MS Alm Clay, M.D., M.S. Interventional Cardiologist  [facility] Pager # [phone]      "

## 2024-08-07 NOTE — Progress Notes (Unsigned)
 " Cardiology Office Note:  .   Date:  08/12/2024  ID:  Theodore Jacobs, DOB [birth year], MRN [id number] PCP: Campbell Reynolds, NP   HeartCare Providers Cardiologist:  Alm Clay, MD     Chief Complaint  Patient presents with   New Patient (Initial Visit)    Referral because of hypertensive heart disease with heart failure, and thoracic aortic aneurysm     Patient Profile: .     Theodore Jacobs is a  69 y.o. male ongoing smoker (1/3 PPD) with a PMH reviewed below who presents here for Hypertensive Heart Disease and Dilated Thoracic Aorta at the request of Campbell Reynolds, NP [facility] Providers).  PMH:  Hypertension  Echocardiogram ordered Dilated thoracic aorta-noted on CT scan - 4.2 cm Cigarette smoker-COPD/emphysema; lung nodule Hyperlipidemia Hepatic cyst-history of alcohol abuse Iron deficiency anemia Hypoparathyroidism History of marijuana use MDD     Theodore Jacobs was seen on August 28, 2023: Denied any chest pain or dyspnea.  No edema.  Noted that his cigarette smoking was down to one third of a pack a day.  Also occasional smokes marijuana (usually when friends are around).  Noted some cough and congestion resolving with nasal spray.  Notably denied chest pain or dyspnea at.  No palpitations.  Somehow still had the diagnosis of hypertensive heart disease with heart failure based on an abnormal EKG.  An echocardiogram was ordered with the previous diagnosis of dilated thoracic aorta of 4.2 cm (Not Aneurysmal).  Subjective  Discussed the use of AI scribe software for clinical note transcription with the patient, who gave verbal consent to proceed.  History of Present Illness Theodore Jacobs is a 69 year old male who presents for evaluation of aortic valve concerns and follow-up on prior imaging studies. He was referred by Dr. Nancyann Dustman for evaluation of aortic valve concerns.  He underwent an echocardiogram of his heart at [facility] but has not  received the results. A CT scan was performed to check for a pulmonary embolism, which was normal, but it noted a slightly enlarged aorta measuring 4.2 cm.  No symptoms such as chest pain, pressure, tightness, shortness of breath, heart racing, or swelling in his legs. No symptoms of heart failure such as orthopnea or paroxysmal nocturnal dyspnea.  His past medical history includes hypertension, COPD, high cholesterol, and anemia. He is currently taking lisinopril 10 mg for blood pressure, rosuvastatin 10 mg for cholesterol, and uses Symbicort inhaler twice daily for COPD. He also uses an albuterol inhaler as needed. He occasionally takes Tylenol.  He has a history of anemia with a low hemoglobin level of 7.2 in January of the previous year and was on iron supplements. His cholesterol levels from the same period were total cholesterol 161, HDL 68, LDL 76, and triglycerides 88. His creatinine was 0.72, potassium 5.1, and TSH 2.2. He denies any recent blood work this year.  He has a history of smoking and acknowledges that he continues to smoke. He mentions living in [place] previously. He also has a history of depression.  Cardiovascular ROS: positive for - dyspnea on exertion, shortness of breath, and stable with his COPD and smoking negative for - chest pain, irregular heartbeat, orthopnea, palpitations, paroxysmal nocturnal dyspnea, rapid heart rate, or lightheadedness, dizziness or wooziness, syncope or near syncope, TIA or amaurosis fugax, claudication     Objective   Meds from PCPs note April 2025 include: Rosuvastatin 10 mg daily, lisinopril 10 mg daily;  Symbicort 160-4.5 mcg inhaler 2 puffs twice daily albuterol 1 to 2 puffs every 6 hours as needed.  Flonase nasal 2 sprays twice daily for congestion. . NKDA Active Medications[1]  - Albuterol (rescue inhaler) - Symbicort (2 puffs twice a day) - Lisinopril 10 mg - Rosuvastatin 10 mg - Occasionally takes Tylenol - Iron  supplement  Social History: Smokes a pack of cigarettes in 3 days and drinks 1 or 2 beers every night.  Occasional marijuana use.;  Married.  He has 1 son, 1 grandson and 2 great-grandchildren   Studies Reviewed: SABRA   EKG Interpretation Date/Time:  Monday August 09 2024 10:19:30 EST Ventricular Rate:  86 PR Interval:  136 QRS Duration:  82 QT Interval:  374 QTC Calculation: 447 R Axis:   60  Text Interpretation: Normal sinus rhythm Normal ECG When compared with ECG of 15-Oct-2022 08:01, No significant change since last tracing Confirmed by Anner Lenis ([id number]) on 08/09/2024 11:19:56 AM    Results Labs Total cholesterol (08/28/2023): 161 HDL (08/28/2023): 68 LDL (98/69/7974): 76 Triglycerides (08/28/2023): 88 Iron (08/28/2023): 45 (low) Percent saturation (08/28/2023): 14% Creatinine (08/28/2023): 0.72 Potassium (08/28/2023): 5.1 LFTs (08/28/2023): Within normal limits Hemoglobin (08/28/2023): 7.2 (low), decreased from 9.3 on 06/2023 TSH (08/28/2023): 2.2 A1c (08/28/2023): 4.5  Radiology Chest CT Angiography-PE (09/2022): Ascending aorta diameter 4.2 cm; mild thoracic aortic ectasia; no pulmonary embolism patchy RML and lingula opacity likely reflecting atelectasis.  7 mm RML nodule.  Prominent nonpathological mediastinal lymphadenopathy.  Diagnostic EKG (08/09/2024): No criteria for left ventricular hypertrophy; no significant abnormalities (Independently interpreted) ECHO done - but results not available  Risk Assessment/Calculations:        Physical Exam:   VS:  BP (!) 104/54 (BP Location: Right Arm, Patient Position: Sitting, Cuff Size: Normal)   Pulse 99   Ht 5' 11 (1.803 m)   Wt 127 lb (57.6 kg)   SpO2 99%   BMI 17.71 kg/m    Wt Readings from Last 3 Encounters:  08/09/24 127 lb (57.6 kg)  10/15/22 135 lb (61.2 kg)  10/04/14 150 lb (68 kg)     GEN: Well nourished, well groomed; in no acute distress; essentially healthy appearing. NECK: No JVD; No carotid  bruits CARDIAC: Normal S1, S2; RRR, cannot exclude soft 1/6 SEM but otherwise no murmurs, rubs, gallops RESPIRATORY:  Clear to auscultation without rales, wheezing or rhonchi ; nonlabored, good air movement. ABDOMEN: Soft, non-tender, non-distended EXTREMITIES:  No edema; No deformity     ASSESSMENT AND PLAN: .   Dilation of thoracic aorta Recommendation for dilated thoracic aorta is annual follow-up.  4.2 cm is not aneurysmal.  There is dilation above the upper normal of 3.9, but not to the extent of aneurysm.  Not distended we would be concerned about potential intervention.  2D echocardiogram was ordered to evaluate, but I do not have the results.  Would asked that PCPs office send both the result and the images.  If stable after 1 or 2 checks, would space out follow-up every couple years..  - Ordered CTA for thoracic aorta. - Requested echocardi[facility] from [facility]. - Follow-up in 3-4 months for CT and echocardiogram review.  Primary hypertension BP well-controlled on minimal medications - Continue lisinopril 10 mg daily.  Hyperlipidemia With evidence of thoracic aortic disease, would try to achieve an LDL least less than 100 if not less than 70.  Most recent lipids had 1 year ago in January 2025 had an LDL of 76 on 10 mg  rosuvastatin.  Pretty much at target. -Continue current dose of rosuvastatin  Current smoker Smoking cessation instruction/counseling given:  counseled patient on the dangers of tobacco use, advised patient to stop smoking, and reviewed strategies to maximize success   Orders Placed This Encounter  Procedures   CT ANGIO CHEST AORTA W/ & OR WO/CM & GATING (HEART & VASCULAR TOWER ONLY)   EKG 12-Lead           Follow-Up: Return for 3-4 month follow-up, To discuss test results.      Signed, Alm MICAEL Clay, MD, MS Alm Clay, M.D., M.S. Interventional Cardiologist  [facility] Pager # [phone]          [1]   Current Meds  Medication Sig   albuterol (VENTOLIN HFA) 108 (90 Base) MCG/ACT inhaler Inhale 1-2 puffs into the lungs every 6 (six) hours as needed for wheezing or shortness of breath.   budesonide-formoterol (SYMBICORT) 160-4.5 MCG/ACT inhaler Inhale 2 puffs into the lungs 2 (two) times daily.   ferrous sulfate  325 (65 FE) MG tablet Take 1 tablet (325 mg total) by mouth daily.   fluticasone (FLONASE) 50 MCG/ACT nasal spray Place into both nostrils daily.   folic acid  (FOLVITE ) 1 MG tablet Take 1 mg by mouth daily.   guaiFENesin (MUCINEX) 600 MG 12 hr tablet Take 600 mg by mouth as needed for cough.   ibuprofen  (ADVIL ,MOTRIN ) 800 MG tablet Take 1 tablet (800 mg total) by mouth every 8 (eight) hours as needed.   lisinopril (ZESTRIL) 10 MG tablet Take 10 mg by mouth daily.   rosuvastatin (CRESTOR) 10 MG tablet Take 10 mg by mouth daily.   [DISCONTINUED] doxycycline  (VIBRAMYCIN ) 100 MG capsule Take 1 capsule (100 mg total) by mouth 2 (two) times daily.   "

## 2024-08-09 ENCOUNTER — Ambulatory Visit: Admitting: Cardiology

## 2024-08-09 VITALS — BP 104/54 | HR 99 | Ht 71.0 in | Wt 127.0 lb

## 2024-08-09 DIAGNOSIS — E782 Mixed hyperlipidemia: Secondary | ICD-10-CM | POA: Diagnosis not present

## 2024-08-09 DIAGNOSIS — I10 Essential (primary) hypertension: Secondary | ICD-10-CM

## 2024-08-09 DIAGNOSIS — F17200 Nicotine dependence, unspecified, uncomplicated: Secondary | ICD-10-CM | POA: Diagnosis not present

## 2024-08-09 DIAGNOSIS — I77810 Thoracic aortic ectasia: Secondary | ICD-10-CM

## 2024-08-09 NOTE — Patient Instructions (Addendum)
 Medication Instructions:  No changes   *If you need a refill on your cardiac medications before your next appointment, please call your pharmacy*   Lab Work: Not needed    Testing/Procedures:  Non-Cardiac CT Angiography (CTA), is a special type of CT scan that uses a computer to produce multi-dimensional views of major blood vessels - aorta. In CT angiography, a contrast material is injected through an IV to help visualize the blood vessels   Follow-Up: At [facility], you and your health needs are our priority.  As part of our continuing mission to provide you with exceptional heart care, we have created designated Provider Care Teams.  These Care Teams include your primary Cardiologist (physician) and Advanced Practice Providers (APPs -  Physician Assistants and Nurse Practitioners) who all work together to provide you with the care you need, when you need it.     Your next appointment:    3 to 4 month(s)  The format for your next appointment:   In Person  Provider:   Alm Clay, MD

## 2024-08-12 ENCOUNTER — Encounter: Payer: Self-pay | Admitting: Cardiology

## 2024-08-12 NOTE — Assessment & Plan Note (Addendum)
 BP well-controlled on minimal medications - Continue lisinopril 10 mg daily.

## 2024-08-12 NOTE — Assessment & Plan Note (Signed)
 Smoking cessation instruction/counseling given:  counseled patient on the dangers of tobacco use, advised patient to stop smoking, and reviewed strategies to maximize success

## 2024-08-12 NOTE — Assessment & Plan Note (Addendum)
 Recommendation for dilated thoracic aorta is annual follow-up.  4.2 cm is not aneurysmal.  There is dilation above the upper normal of 3.9, but not to the extent of aneurysm.  Not distended we would be concerned about potential intervention.  2D echocardiogram was ordered to evaluate, but I do not have the results.  Would asked that PCPs office send both the result and the images.  If stable after 1 or 2 checks, would space out follow-up every couple years..  - Ordered CTA for thoracic aorta. - Requested echocardiogram results from [facility]. - Follow-up in 3-4 months for CT and echocardiogram review.

## 2024-08-12 NOTE — Assessment & Plan Note (Signed)
 With evidence of thoracic aortic disease, would try to achieve an LDL least less than 100 if not less than 70.  Most recent lipids had 1 year ago in January 2025 had an LDL of 76 on 10 mg rosuvastatin.  Pretty much at target. -Continue current dose of rosuvastatin

## 2024-11-08 ENCOUNTER — Ambulatory Visit: Admitting: Cardiology
